# Patient Record
Sex: Female | Born: 2000 | Race: Black or African American | Hispanic: No | Marital: Single | Smoking: Never smoker
Health system: Southern US, Community
[De-identification: ages and names within clinical notes are randomized; demographics above are authoritative.]

---
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
## 2022-05-23 DIAGNOSIS — Z1388 Encounter for screening for disorder due to exposure to contaminants: Secondary | ICD-10-CM | POA: Diagnosis not present

## 2022-05-23 DIAGNOSIS — Z0389 Encounter for observation for other suspected diseases and conditions ruled out: Secondary | ICD-10-CM | POA: Diagnosis not present

## 2022-05-23 DIAGNOSIS — Z3009 Encounter for other general counseling and advice on contraception: Secondary | ICD-10-CM | POA: Diagnosis not present

## 2023-03-04 DIAGNOSIS — Z419 Encounter for procedure for purposes other than remedying health state, unspecified: Secondary | ICD-10-CM | POA: Diagnosis not present

## 2023-04-02 DIAGNOSIS — Z113 Encounter for screening for infections with a predominantly sexual mode of transmission: Secondary | ICD-10-CM | POA: Diagnosis not present

## 2023-04-02 DIAGNOSIS — Z331 Pregnant state, incidental: Secondary | ICD-10-CM | POA: Diagnosis not present

## 2023-04-02 DIAGNOSIS — B3731 Acute candidiasis of vulva and vagina: Secondary | ICD-10-CM | POA: Diagnosis not present

## 2023-04-04 DIAGNOSIS — Z419 Encounter for procedure for purposes other than remedying health state, unspecified: Secondary | ICD-10-CM | POA: Diagnosis not present

## 2023-04-05 DIAGNOSIS — Z331 Pregnant state, incidental: Secondary | ICD-10-CM | POA: Diagnosis not present

## 2023-04-05 DIAGNOSIS — A5600 Chlamydial infection of lower genitourinary tract, unspecified: Secondary | ICD-10-CM | POA: Diagnosis not present

## 2023-04-30 DIAGNOSIS — Z3689 Encounter for other specified antenatal screening: Secondary | ICD-10-CM | POA: Diagnosis not present

## 2023-04-30 DIAGNOSIS — Z3481 Encounter for supervision of other normal pregnancy, first trimester: Secondary | ICD-10-CM | POA: Diagnosis not present

## 2023-04-30 DIAGNOSIS — Z3201 Encounter for pregnancy test, result positive: Secondary | ICD-10-CM | POA: Diagnosis not present

## 2023-04-30 DIAGNOSIS — Z3A13 13 weeks gestation of pregnancy: Secondary | ICD-10-CM | POA: Diagnosis not present

## 2023-05-05 DIAGNOSIS — Z419 Encounter for procedure for purposes other than remedying health state, unspecified: Secondary | ICD-10-CM | POA: Diagnosis not present

## 2023-05-08 DIAGNOSIS — Z113 Encounter for screening for infections with a predominantly sexual mode of transmission: Secondary | ICD-10-CM | POA: Diagnosis not present

## 2023-05-08 DIAGNOSIS — B379 Candidiasis, unspecified: Secondary | ICD-10-CM | POA: Diagnosis not present

## 2023-05-21 DIAGNOSIS — Z3A16 16 weeks gestation of pregnancy: Secondary | ICD-10-CM | POA: Diagnosis not present

## 2023-05-21 DIAGNOSIS — Z3482 Encounter for supervision of other normal pregnancy, second trimester: Secondary | ICD-10-CM | POA: Diagnosis not present

## 2023-06-04 DIAGNOSIS — Z419 Encounter for procedure for purposes other than remedying health state, unspecified: Secondary | ICD-10-CM | POA: Diagnosis not present

## 2023-06-18 DIAGNOSIS — Z363 Encounter for antenatal screening for malformations: Secondary | ICD-10-CM | POA: Diagnosis not present

## 2023-07-05 DIAGNOSIS — Z419 Encounter for procedure for purposes other than remedying health state, unspecified: Secondary | ICD-10-CM | POA: Diagnosis not present

## 2023-08-04 DIAGNOSIS — Z419 Encounter for procedure for purposes other than remedying health state, unspecified: Secondary | ICD-10-CM | POA: Diagnosis not present

## 2023-08-08 DIAGNOSIS — Z3689 Encounter for other specified antenatal screening: Secondary | ICD-10-CM | POA: Diagnosis not present

## 2023-08-08 DIAGNOSIS — O98813 Other maternal infectious and parasitic diseases complicating pregnancy, third trimester: Secondary | ICD-10-CM | POA: Diagnosis not present

## 2023-08-08 DIAGNOSIS — B3731 Acute candidiasis of vulva and vagina: Secondary | ICD-10-CM | POA: Diagnosis not present

## 2023-08-08 DIAGNOSIS — Z3A28 28 weeks gestation of pregnancy: Secondary | ICD-10-CM | POA: Diagnosis not present

## 2023-09-04 DIAGNOSIS — Z419 Encounter for procedure for purposes other than remedying health state, unspecified: Secondary | ICD-10-CM | POA: Diagnosis not present

## 2023-09-11 DIAGNOSIS — Z3689 Encounter for other specified antenatal screening: Secondary | ICD-10-CM | POA: Diagnosis not present

## 2023-09-11 DIAGNOSIS — Z36 Encounter for antenatal screening for chromosomal anomalies: Secondary | ICD-10-CM | POA: Diagnosis not present

## 2023-09-11 DIAGNOSIS — Z3A33 33 weeks gestation of pregnancy: Secondary | ICD-10-CM | POA: Diagnosis not present

## 2023-09-11 DIAGNOSIS — O402XX0 Polyhydramnios, second trimester, not applicable or unspecified: Secondary | ICD-10-CM | POA: Diagnosis not present

## 2023-10-03 DIAGNOSIS — Z3689 Encounter for other specified antenatal screening: Secondary | ICD-10-CM | POA: Diagnosis not present

## 2023-10-05 DIAGNOSIS — Z419 Encounter for procedure for purposes other than remedying health state, unspecified: Secondary | ICD-10-CM | POA: Diagnosis not present

## 2023-10-17 DIAGNOSIS — Z3A38 38 weeks gestation of pregnancy: Secondary | ICD-10-CM | POA: Diagnosis not present

## 2023-10-17 DIAGNOSIS — B3731 Acute candidiasis of vulva and vagina: Secondary | ICD-10-CM | POA: Diagnosis not present

## 2023-10-17 DIAGNOSIS — O98813 Other maternal infectious and parasitic diseases complicating pregnancy, third trimester: Secondary | ICD-10-CM | POA: Diagnosis not present

## 2023-10-20 DIAGNOSIS — O471 False labor at or after 37 completed weeks of gestation: Secondary | ICD-10-CM | POA: Diagnosis not present

## 2023-10-20 DIAGNOSIS — Z3A00 Weeks of gestation of pregnancy not specified: Secondary | ICD-10-CM | POA: Diagnosis not present

## 2023-10-29 DIAGNOSIS — O9902 Anemia complicating childbirth: Secondary | ICD-10-CM | POA: Diagnosis not present

## 2023-10-29 DIAGNOSIS — Z3A40 40 weeks gestation of pregnancy: Secondary | ICD-10-CM | POA: Diagnosis not present

## 2023-10-29 DIAGNOSIS — O480 Post-term pregnancy: Secondary | ICD-10-CM | POA: Diagnosis not present

## 2023-10-30 DIAGNOSIS — Z3A40 40 weeks gestation of pregnancy: Secondary | ICD-10-CM | POA: Diagnosis not present

## 2023-10-30 DIAGNOSIS — O368330 Maternal care for abnormalities of the fetal heart rate or rhythm, third trimester, not applicable or unspecified: Secondary | ICD-10-CM | POA: Diagnosis not present

## 2023-10-30 DIAGNOSIS — O480 Post-term pregnancy: Secondary | ICD-10-CM | POA: Diagnosis not present

## 2023-10-30 DIAGNOSIS — Z3490 Encounter for supervision of normal pregnancy, unspecified, unspecified trimester: Secondary | ICD-10-CM | POA: Diagnosis not present

## 2023-10-31 DIAGNOSIS — O480 Post-term pregnancy: Secondary | ICD-10-CM | POA: Diagnosis not present

## 2023-10-31 DIAGNOSIS — O368330 Maternal care for abnormalities of the fetal heart rate or rhythm, third trimester, not applicable or unspecified: Secondary | ICD-10-CM | POA: Diagnosis not present

## 2023-10-31 DIAGNOSIS — Z3A40 40 weeks gestation of pregnancy: Secondary | ICD-10-CM | POA: Diagnosis not present

## 2023-11-02 DIAGNOSIS — Z419 Encounter for procedure for purposes other than remedying health state, unspecified: Secondary | ICD-10-CM | POA: Diagnosis not present

## 2023-12-14 DIAGNOSIS — Z419 Encounter for procedure for purposes other than remedying health state, unspecified: Secondary | ICD-10-CM | POA: Diagnosis not present

## 2024-01-13 DIAGNOSIS — Z419 Encounter for procedure for purposes other than remedying health state, unspecified: Secondary | ICD-10-CM | POA: Diagnosis not present

## 2024-02-13 DIAGNOSIS — Z419 Encounter for procedure for purposes other than remedying health state, unspecified: Secondary | ICD-10-CM | POA: Diagnosis not present

## 2024-03-03 DIAGNOSIS — Z113 Encounter for screening for infections with a predominantly sexual mode of transmission: Secondary | ICD-10-CM | POA: Diagnosis not present

## 2024-03-03 DIAGNOSIS — Z114 Encounter for screening for human immunodeficiency virus [HIV]: Secondary | ICD-10-CM | POA: Diagnosis not present

## 2024-03-14 DIAGNOSIS — Z419 Encounter for procedure for purposes other than remedying health state, unspecified: Secondary | ICD-10-CM | POA: Diagnosis not present

## 2024-03-23 DIAGNOSIS — Z114 Encounter for screening for human immunodeficiency virus [HIV]: Secondary | ICD-10-CM | POA: Diagnosis not present

## 2024-03-23 DIAGNOSIS — Z113 Encounter for screening for infections with a predominantly sexual mode of transmission: Secondary | ICD-10-CM | POA: Diagnosis not present

## 2024-03-23 DIAGNOSIS — N760 Acute vaginitis: Secondary | ICD-10-CM | POA: Diagnosis not present

## 2024-03-23 DIAGNOSIS — B3731 Acute candidiasis of vulva and vagina: Secondary | ICD-10-CM | POA: Diagnosis not present

## 2024-04-01 DIAGNOSIS — Z0120 Encounter for dental examination and cleaning without abnormal findings: Secondary | ICD-10-CM | POA: Diagnosis not present

## 2024-04-14 DIAGNOSIS — Z419 Encounter for procedure for purposes other than remedying health state, unspecified: Secondary | ICD-10-CM | POA: Diagnosis not present

## 2024-05-15 DIAGNOSIS — Z419 Encounter for procedure for purposes other than remedying health state, unspecified: Secondary | ICD-10-CM | POA: Diagnosis not present

## 2024-06-02 DIAGNOSIS — Z114 Encounter for screening for human immunodeficiency virus [HIV]: Secondary | ICD-10-CM | POA: Diagnosis not present

## 2024-06-02 DIAGNOSIS — Z113 Encounter for screening for infections with a predominantly sexual mode of transmission: Secondary | ICD-10-CM | POA: Diagnosis not present

## 2024-06-02 DIAGNOSIS — B3731 Acute candidiasis of vulva and vagina: Secondary | ICD-10-CM | POA: Diagnosis not present

## 2024-06-02 DIAGNOSIS — N760 Acute vaginitis: Secondary | ICD-10-CM | POA: Diagnosis not present
